# Patient Record
Sex: Male | Born: 1950 | Race: White | Hispanic: No | Smoking: Current every day smoker
Health system: Southern US, Community
[De-identification: ages and names within clinical notes are randomized; demographics above are authoritative.]

## PROBLEM LIST (undated history)

## (undated) DIAGNOSIS — H3530 Unspecified macular degeneration: Secondary | ICD-10-CM

## (undated) DIAGNOSIS — J449 Chronic obstructive pulmonary disease, unspecified: Secondary | ICD-10-CM

## (undated) DIAGNOSIS — F32A Depression, unspecified: Secondary | ICD-10-CM

## (undated) DIAGNOSIS — F329 Major depressive disorder, single episode, unspecified: Secondary | ICD-10-CM

## (undated) DIAGNOSIS — M810 Age-related osteoporosis without current pathological fracture: Secondary | ICD-10-CM

## (undated) DIAGNOSIS — I739 Peripheral vascular disease, unspecified: Secondary | ICD-10-CM

## (undated) DIAGNOSIS — E785 Hyperlipidemia, unspecified: Secondary | ICD-10-CM

## (undated) DIAGNOSIS — I219 Acute myocardial infarction, unspecified: Secondary | ICD-10-CM

## (undated) DIAGNOSIS — I2510 Atherosclerotic heart disease of native coronary artery without angina pectoris: Secondary | ICD-10-CM

## (undated) DIAGNOSIS — I10 Essential (primary) hypertension: Secondary | ICD-10-CM

## (undated) HISTORY — DX: Unspecified macular degeneration: H35.30

## (undated) HISTORY — DX: Acute myocardial infarction, unspecified: I21.9

## (undated) HISTORY — DX: Peripheral vascular disease, unspecified: I73.9

## (undated) HISTORY — DX: Depression, unspecified: F32.A

## (undated) HISTORY — DX: Age-related osteoporosis without current pathological fracture: M81.0

## (undated) HISTORY — DX: Major depressive disorder, single episode, unspecified: F32.9

## (undated) HISTORY — DX: Chronic obstructive pulmonary disease, unspecified: J44.9

## (undated) HISTORY — DX: Essential (primary) hypertension: I10

## (undated) HISTORY — DX: Atherosclerotic heart disease of native coronary artery without angina pectoris: I25.10

## (undated) HISTORY — DX: Hyperlipidemia, unspecified: E78.5

---

## 2007-04-19 ENCOUNTER — Other Ambulatory Visit: Payer: Self-pay

## 2007-04-19 ENCOUNTER — Emergency Department: Payer: Self-pay | Admitting: Emergency Medicine

## 2007-04-27 ENCOUNTER — Other Ambulatory Visit: Payer: Self-pay

## 2007-04-27 ENCOUNTER — Emergency Department: Payer: Self-pay | Admitting: Emergency Medicine

## 2007-06-14 HISTORY — PX: CORONARY ARTERY BYPASS GRAFT: SHX141

## 2009-02-09 ENCOUNTER — Emergency Department: Payer: Self-pay | Admitting: Emergency Medicine

## 2009-08-18 ENCOUNTER — Ambulatory Visit: Payer: Self-pay

## 2009-11-17 ENCOUNTER — Ambulatory Visit: Payer: Self-pay | Admitting: Cardiology

## 2009-11-17 ENCOUNTER — Inpatient Hospital Stay: Payer: Self-pay | Admitting: Internal Medicine

## 2011-04-20 IMAGING — CT CT ANGIOGRAPHY NECK
1 of 4 series · 12 of 33 positions shown · IV contrast (APPLIED)
Comparison: none

REASON FOR EXAM: carotid stenosis, see US report
COMMENTS:

PROCEDURE:     CT  - CT ANGIOGRAPHY NECK W/CONTRAST  - November 18, 2009  [DATE]
RESULT:     Indication: Carotid stenosis
Comparisons: None
TECHNIQUE: 100 ml of Isovue 370 was administered and the extracranial
carotid arteries bilaterally were scanned during the arterial phase from the
level of the superior portion of the frontal sinuses to the proximal neck.
These images were then transferred to the Siemens work station and were
subsequently reviewed utilizing 3-D reconstructions and MIP images.

[Series 4: soft tissue · axial · 0.39mm/px · z∈[+644,+911]mm · 12 of 107 slices shown]
[im 9/107  soft-tissue]
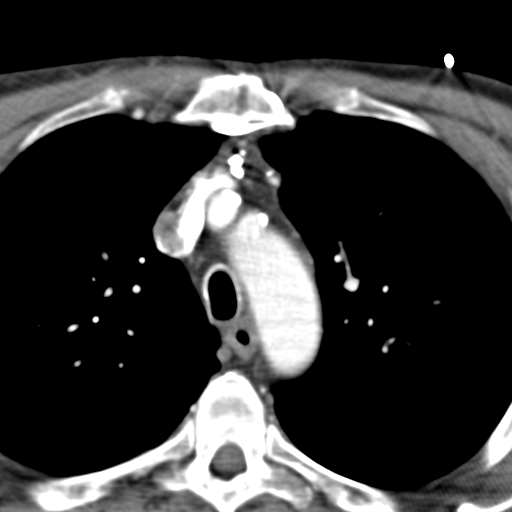
[im 17/107  bone]
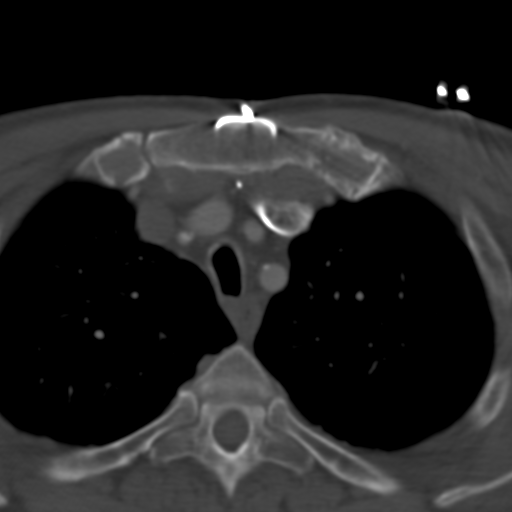
[im 25/107  soft-tissue]
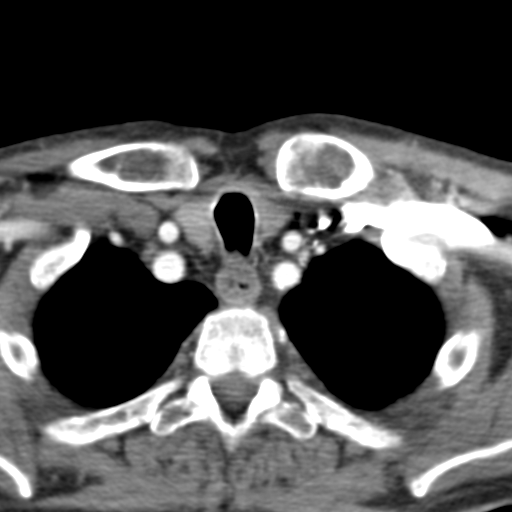
[im 33/107  bone]
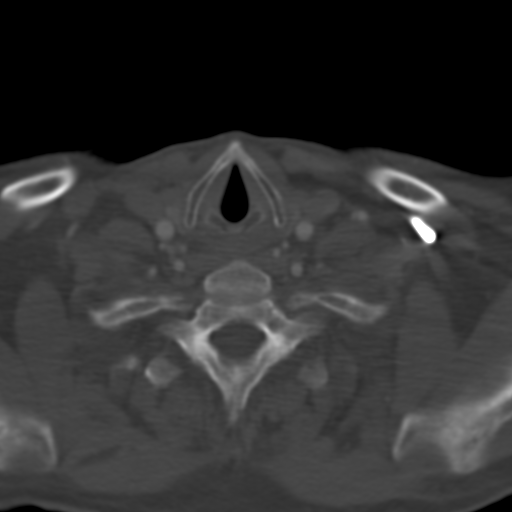
[im 41/107  soft-tissue]
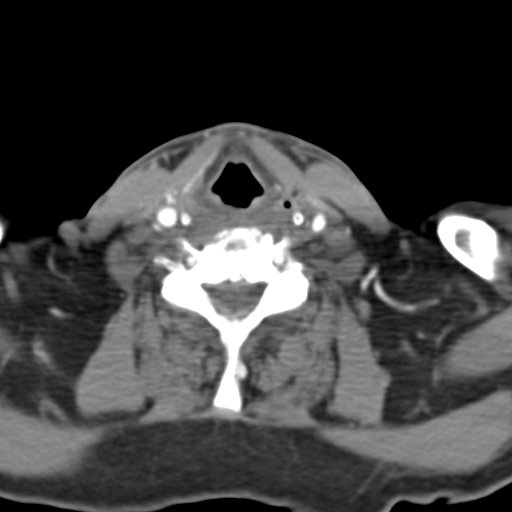
[im 49/107  bone]
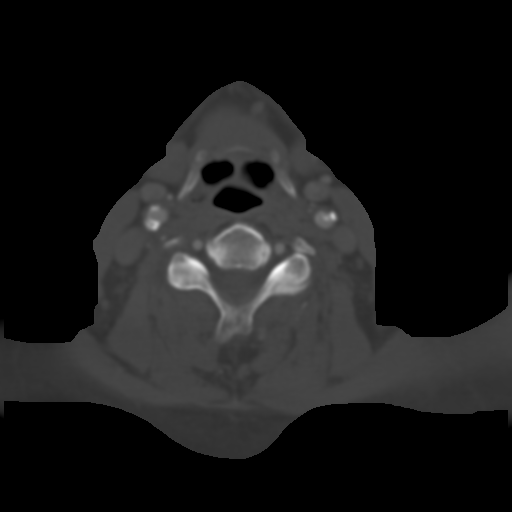
[im 58/107  soft-tissue]
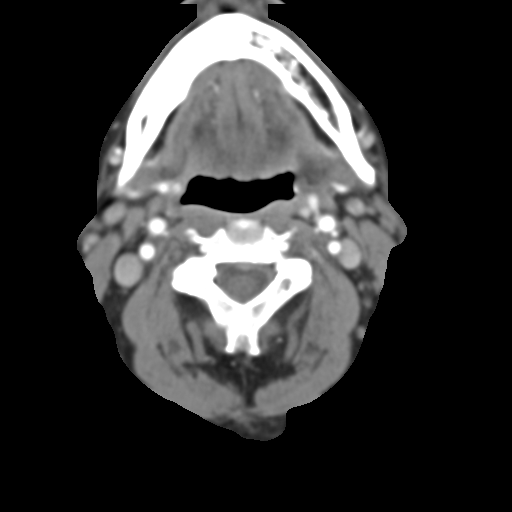
[im 66/107  bone]
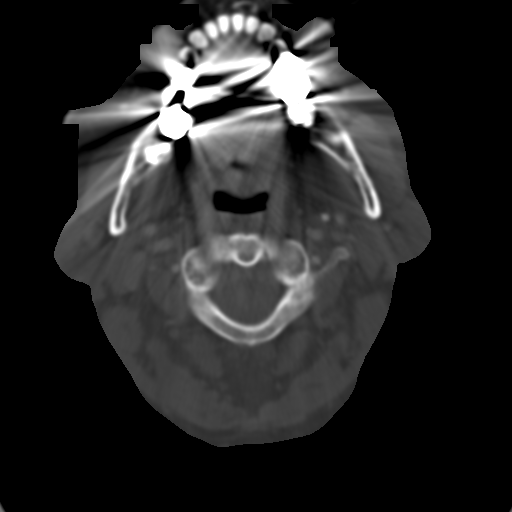
[im 74/107  soft-tissue]
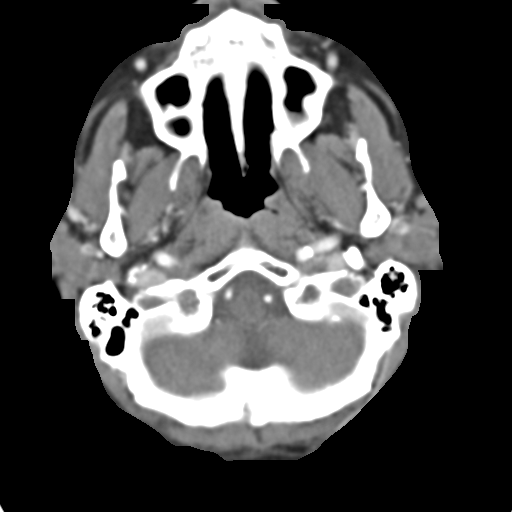
[im 82/107  bone]
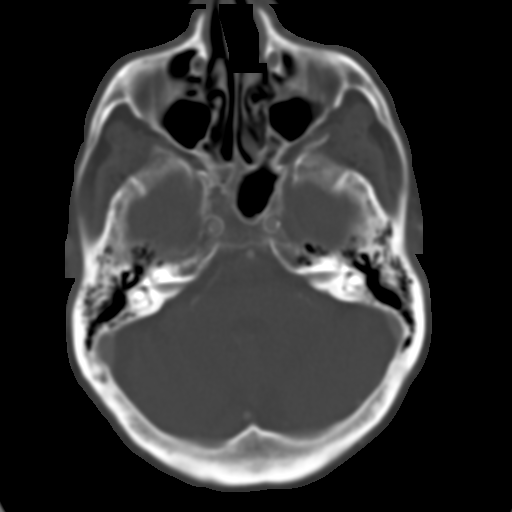
[im 90/107  soft-tissue]
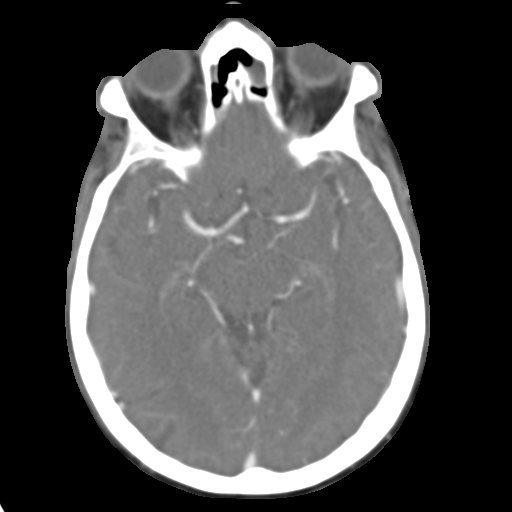
[im 98/107  bone]
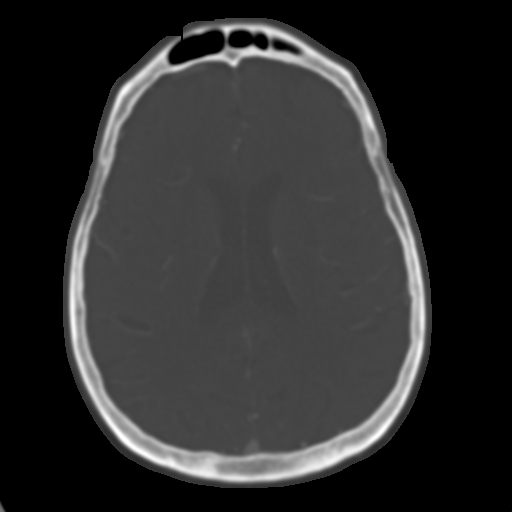

[12 of 33 positions shown; findings below may reference images not displayed]

FINDINGS: A three-vessel configuration of the aortic arch is identified with normal
ostium. The vertebral arteries are identified arising from the subclavian
arteries and entering the transverse foramen bilaterally at C6.

The carotid arteries are identified and are symmetric and unremarkable.
There is no evidence of aneurysm or carotid dissection bilaterally.

Right Carotid Artery: There is a moderate amount of atherosclerotic plaque
within the carotid bulb. There is approximately 50-60% stenosis of the
carotid bulb.

Left Carotid Artery: There is a moderate amount of atherosclerotic plaque
within the carotid bulb with less than 50% stenosis. There is mild
atherosclerotic plaque within the right mid common carotid artery resulting
in approximately 30-40% stenosis.

The visualized portions of the brain are unremarkable.

There is mild degenerative disc disease of the cervical spine most
significant at C5-C6 and C6-C7.
IMPRESSION: 1. Moderate amount of atherosclerotic plaque within the right carotid bulb
resulting in 50-60% stenosis.
2. Moderate amount of atherosclerotic plaque within the left carotid bulb
resulting in less than 50% stenosis.

## 2012-07-11 ENCOUNTER — Ambulatory Visit: Payer: Self-pay | Admitting: Family Medicine

## 2013-04-01 LAB — LIPID PANEL
Cholesterol: 158 mg/dL (ref 0–200)
HDL: 51 mg/dL (ref 35–70)
LDL CALC: 86 mg/dL
TRIGLYCERIDES: 103 mg/dL (ref 40–160)

## 2013-04-01 LAB — BASIC METABOLIC PANEL
BUN: 4 mg/dL (ref 4–21)
Creatinine: 0.6 mg/dL (ref 0.6–1.3)
Glucose: 71 mg/dL
Potassium: 5.2 mmol/L (ref 3.4–5.3)
Sodium: 132 mmol/L — AB (ref 137–147)

## 2013-04-01 LAB — HEPATIC FUNCTION PANEL: ALT: 13 U/L (ref 10–40)

## 2013-08-31 ENCOUNTER — Ambulatory Visit: Payer: Self-pay | Admitting: Vascular Surgery

## 2013-08-31 LAB — CREATININE, SERUM
Creatinine: 0.86 mg/dL (ref 0.60–1.30)
EGFR (Non-African Amer.): >60

## 2013-08-31 LAB — BUN: BUN: 5 mg/dL — AB (ref 7–18)

## 2013-12-11 IMAGING — CR RIGHT HIP - COMPLETE 2+ VIEW
1 series · 2 of 2 positions shown · non-contrast
Comparison: none

REASON FOR EXAM: pain
COMMENTS:

[Series 1: ap · 0.17mm/px · 2 of 2 slices shown]
[im 1/2]
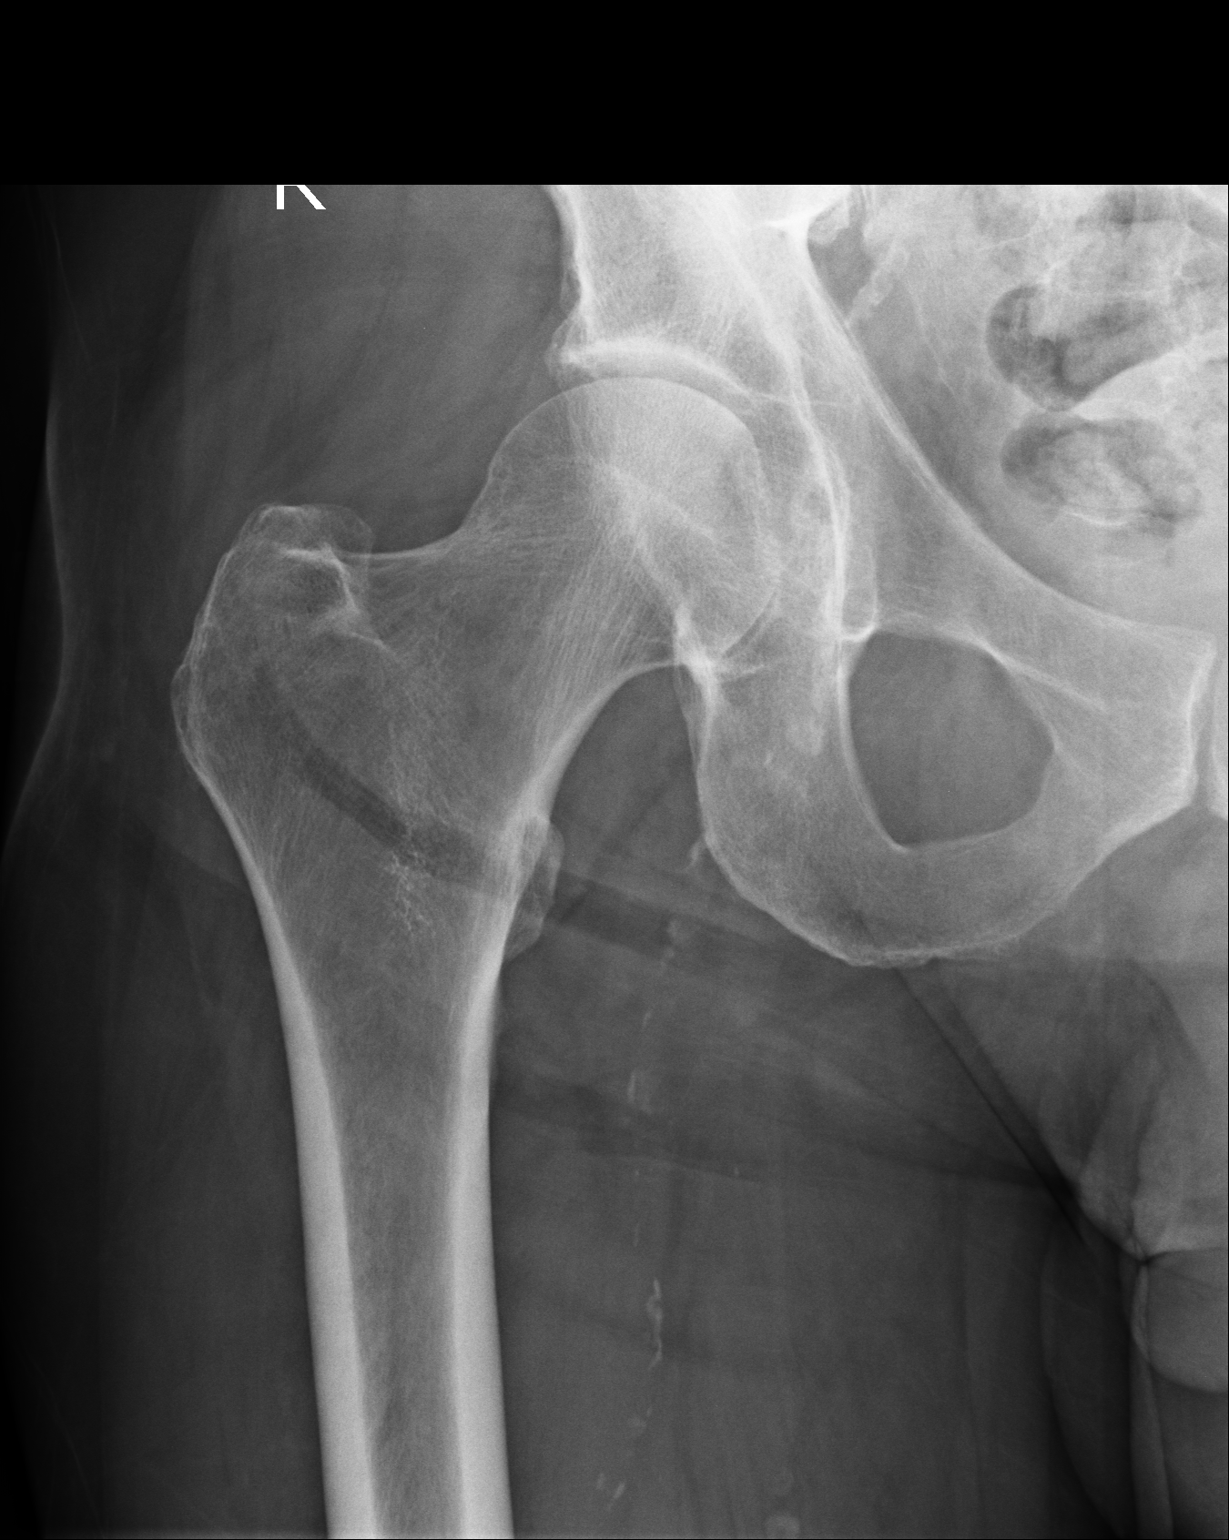
[im 2/2]
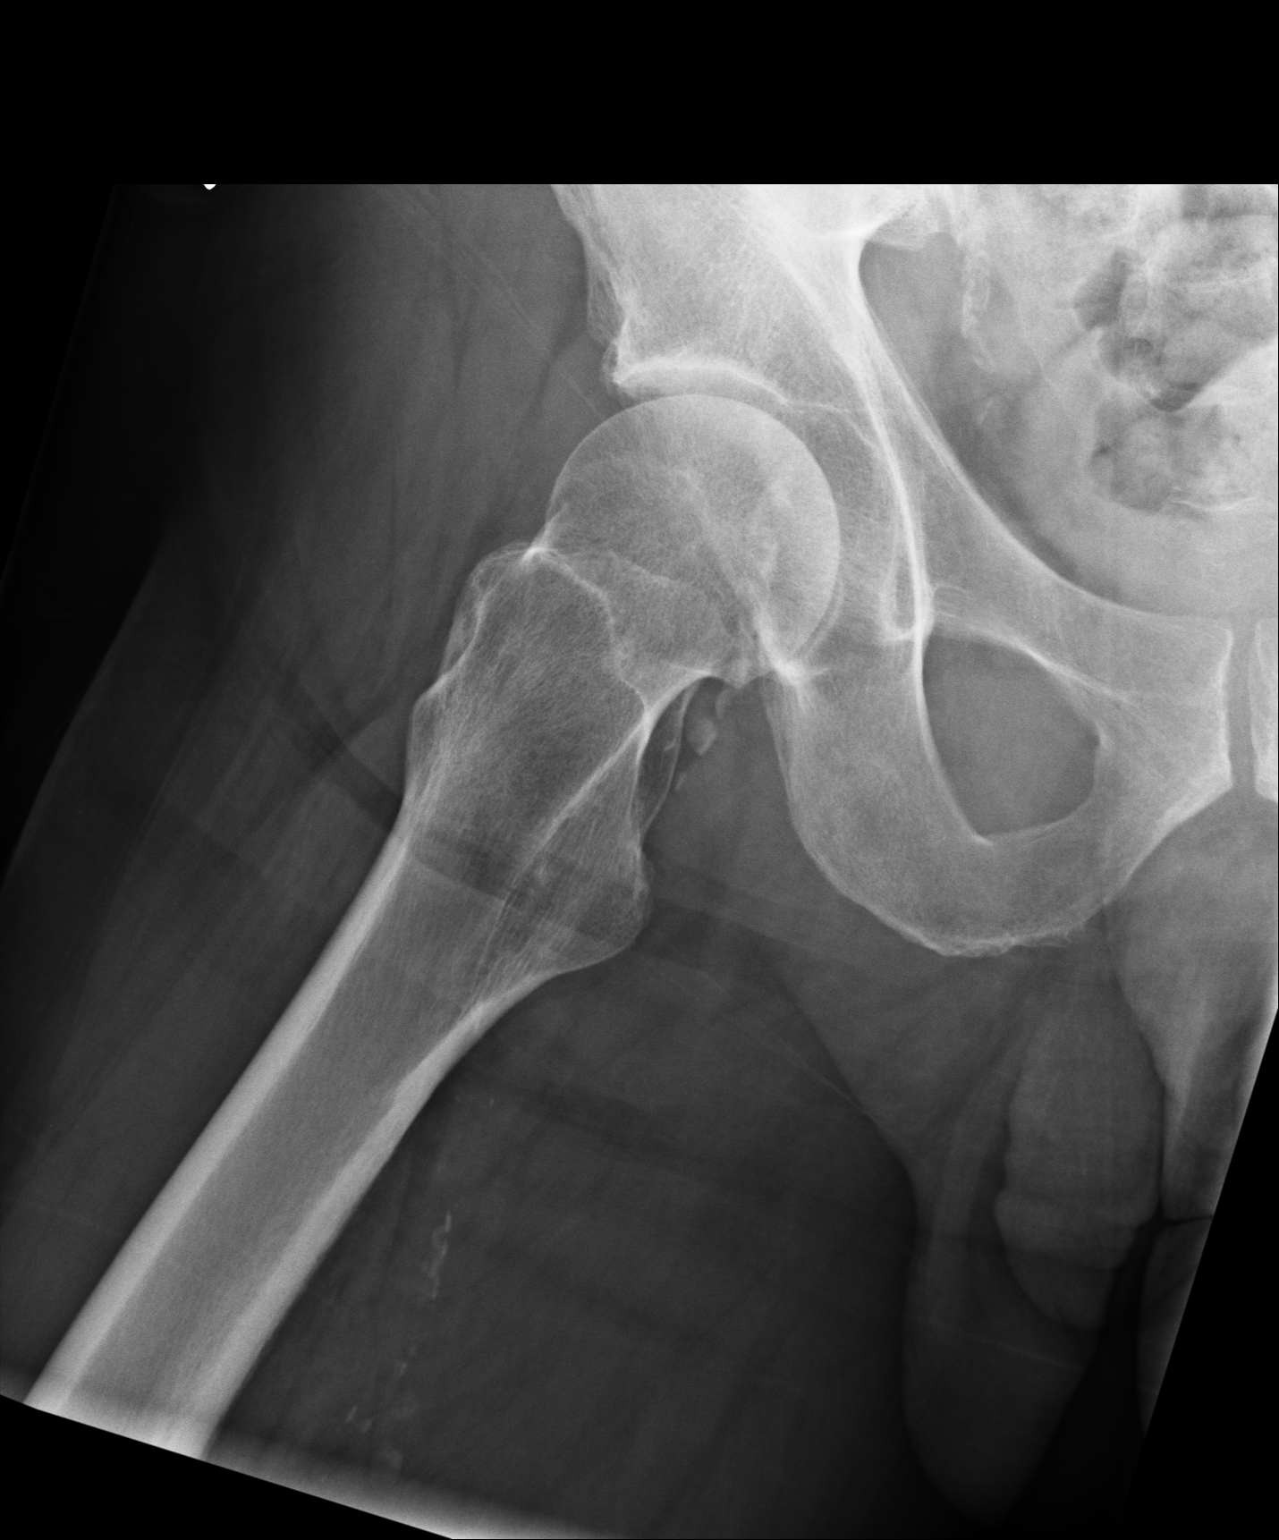

[2 of 2 positions shown; findings below may reference images not displayed]

PROCEDURE:     KDR - KDXR HIP RIGHT COMPLETE  - July 11, 2012 [DATE]

RESULT:     AP and lateral views of the right hip reveal the bones to be
adequately mineralized. The femoral head remains smoothly rounded. I do not
see significant degenerative change. There are calcifications in the wall of
the right femoral artery.
IMPRESSION: There is no acute or significant chronic abnormality of the
right hip.

[REDACTED]

## 2014-05-22 NOTE — Op Note (Signed)
PATIENT NAME:  Richard Kline, Richard Kline MR#:  [id number] DATE OF BIRTH:  1950/04/10  DATE OF PROCEDURE:  08/31/2013  PREOPERATIVE DIAGNOSES: 1.  Peripheral arterial disease with claudication, bilateral lower extremities.   2.  Hypertension.    POSTOPERATIVE DIAGNOSES: 1.  Peripheral arterial disease with claudication, bilateral lower extremities.   2.  Hypertension.    PROCEDURES PERFORMED:  1.  Ultrasound guidance for vascular access to bilateral femoral arteries.  2.  Catheter placement into aorta from bilateral femoral approaches.  3.  Aortogram and iliofemoral arteriogram.  4.  Stent placement to distal aorta with 14 mm diameter x 4 cm length self-expanding stent.  5.  Kissing balloon bilateral common iliac artery stent placements, with an 8 mm diameter x 39  cm length balloon expandable stent to bilateral common iliac arteries, and extension with Kline 9 mm diameter x 29 mm length balloon expandable stent on the right for longer segment disease.  6.  StarClose closure device, bilateral femoral arteries.   SURGEON: Annice NeedyJason S Dew, M.D.   ANESTHESIA: Local with moderate conscious sedation.   ESTIMATED BLOOD LOSS: 25 mL.   INDICATION FOR PROCEDURE: This is Kline 64 year old gentleman with disabling claudication symptoms to bilateral lower extremities. The right is slightly worse than the left. He is brought in for an angiogram for further evaluation and potential treatment. The risks and benefits were discussed. Informed consent was obtained.   DESCRIPTION OF PROCEDURE: The patient is brought to the vascular suite. Groins were shaved and prepped, and Kline sterile surgical field was created. Ultrasound was used to visualize both femoral arteries due to diminished femoral pulses, and both femoral arteries were accessed under ultrasound guidance without difficulty, in patent femoral arteries, in Kline staged fashion. Initially we performed access on the left and took images with the pigtail catheter. This demonstrated  distal aortic disease that was very calcific and moderate in its degree of stenosis, in the 60% to 65% range. There was then disease in the bilateral common iliac arteries that was quite severe. On the left, the degree of stenosis was about 80% to 85%, and tracked down about 1 cm to 1.5 cm into the left common iliac artery. On the right, the disease was Kline little more extensive. This was closer to 95% stenosis and tracked down 2 to 3 cm beyond the bifurcation.  The patient was heparinized. Access was gained on the right, and catheters had been placed into the aorta for bilateral femoral approaches, and intraluminal flow was confirmed. I then placed Magic Torque wires. I put Kline 14 mm diameter x 4 cm length self-expanding stent into the distal aorta to treat this disease. I initially post-dilated this with an 8 mm balloon to open the distal stent. The left side wire was then used to cannulate through the stent without difficulty with Kline Kumpe catheter. Then 8 mm diameter x 39 cm in length stents were placed in Kline kissing balloon fashion in both common iliac arteries, touching their tips in the distal aorta just within the previously placed stent.   Completion angiogram following this showed incomplete coverage of the right iliac lesion that extended about 5 to 8 mm further down from the stent, and I extended Kline 9 mm diameter x 29 mm length balloon expandable stent to cover this lesion, and took Kline 9 mm balloon up the left iliac and used this in Kline kissing balloon fashion while I was pulling the stent on the right. Kline 9 mm balloon was then  taken into the aorta to postdilate the aortic stent and the previous iliac stents as well. at this point, the pigtail catheter was replaced, and excellent flow was seen through patent aorta and bilateral common iliac artery stents, without any stenosis of greater than 10% to 15%. Both hypogastric arteries remained preserved. At this point, I elected to terminate the procedure. StarClose  closure device was deployed in the usual fashion, with excellent hemostatic result in both groins. The patient tolerated the procedure well, and was taken to the recovery room in stable condition.    ____________________________ Annice Needy, MD jsd:cg D: 08/31/2013 09:31:49 ET T: 08/31/2013 09:44:40 ET JOB#: [id number]  cc: Annice Needy, MD, <Dictator> Demetrios Isaacs. Sherrie Mustache, MD Annice Needy MD ELECTRONICALLY SIGNED 09/08/2013 14:14

## 2014-06-17 ENCOUNTER — Ambulatory Visit: Payer: Self-pay | Admitting: Family Medicine

## 2014-06-22 DIAGNOSIS — E871 Hypo-osmolality and hyponatremia: Secondary | ICD-10-CM | POA: Insufficient documentation

## 2014-06-22 DIAGNOSIS — M25551 Pain in right hip: Secondary | ICD-10-CM | POA: Insufficient documentation

## 2014-06-22 DIAGNOSIS — I739 Peripheral vascular disease, unspecified: Secondary | ICD-10-CM | POA: Insufficient documentation

## 2014-06-22 DIAGNOSIS — I10 Essential (primary) hypertension: Secondary | ICD-10-CM | POA: Insufficient documentation

## 2014-06-22 DIAGNOSIS — H3530 Unspecified macular degeneration: Secondary | ICD-10-CM | POA: Insufficient documentation

## 2014-06-22 DIAGNOSIS — E875 Hyperkalemia: Secondary | ICD-10-CM | POA: Insufficient documentation

## 2014-06-22 DIAGNOSIS — N529 Male erectile dysfunction, unspecified: Secondary | ICD-10-CM | POA: Insufficient documentation

## 2014-06-22 DIAGNOSIS — M47812 Spondylosis without myelopathy or radiculopathy, cervical region: Secondary | ICD-10-CM | POA: Insufficient documentation

## 2014-06-22 DIAGNOSIS — E785 Hyperlipidemia, unspecified: Secondary | ICD-10-CM | POA: Insufficient documentation

## 2014-06-22 DIAGNOSIS — M79606 Pain in leg, unspecified: Secondary | ICD-10-CM | POA: Insufficient documentation

## 2014-06-22 DIAGNOSIS — F329 Major depressive disorder, single episode, unspecified: Secondary | ICD-10-CM | POA: Insufficient documentation

## 2014-06-22 DIAGNOSIS — I2510 Atherosclerotic heart disease of native coronary artery without angina pectoris: Secondary | ICD-10-CM | POA: Insufficient documentation

## 2014-06-22 DIAGNOSIS — F32A Depression, unspecified: Secondary | ICD-10-CM | POA: Insufficient documentation

## 2014-06-22 DIAGNOSIS — I252 Old myocardial infarction: Secondary | ICD-10-CM | POA: Insufficient documentation

## 2014-06-22 DIAGNOSIS — J449 Chronic obstructive pulmonary disease, unspecified: Secondary | ICD-10-CM | POA: Insufficient documentation

## 2014-06-22 DIAGNOSIS — Z720 Tobacco use: Secondary | ICD-10-CM | POA: Insufficient documentation

## 2014-07-05 ENCOUNTER — Ambulatory Visit: Payer: Self-pay | Admitting: Family Medicine

## 2014-07-14 ENCOUNTER — Other Ambulatory Visit: Payer: Self-pay | Admitting: Family Medicine

## 2014-08-04 ENCOUNTER — Encounter: Payer: Self-pay | Admitting: Family Medicine

## 2014-08-18 ENCOUNTER — Encounter: Payer: Self-pay | Admitting: Family Medicine

## 2014-09-03 ENCOUNTER — Telehealth: Payer: Self-pay | Admitting: Family Medicine

## 2014-09-03 ENCOUNTER — Encounter: Payer: Self-pay | Admitting: Family Medicine

## 2014-09-03 ENCOUNTER — Ambulatory Visit (INDEPENDENT_AMBULATORY_CARE_PROVIDER_SITE_OTHER): Payer: Commercial Managed Care - HMO | Admitting: Family Medicine

## 2014-09-03 VITALS — BP 140/70 | HR 70 | Temp 98.9°F | Wt 161.0 lb

## 2014-09-03 DIAGNOSIS — M160 Bilateral primary osteoarthritis of hip: Secondary | ICD-10-CM | POA: Diagnosis not present

## 2014-09-03 DIAGNOSIS — Z720 Tobacco use: Secondary | ICD-10-CM

## 2014-09-03 DIAGNOSIS — F329 Major depressive disorder, single episode, unspecified: Secondary | ICD-10-CM

## 2014-09-03 DIAGNOSIS — I2510 Atherosclerotic heart disease of native coronary artery without angina pectoris: Secondary | ICD-10-CM | POA: Diagnosis not present

## 2014-09-03 DIAGNOSIS — J449 Chronic obstructive pulmonary disease, unspecified: Secondary | ICD-10-CM

## 2014-09-03 DIAGNOSIS — Z125 Encounter for screening for malignant neoplasm of prostate: Secondary | ICD-10-CM

## 2014-09-03 DIAGNOSIS — Z0000 Encounter for general adult medical examination without abnormal findings: Secondary | ICD-10-CM

## 2014-09-03 DIAGNOSIS — I10 Essential (primary) hypertension: Secondary | ICD-10-CM | POA: Diagnosis not present

## 2014-09-03 DIAGNOSIS — E785 Hyperlipidemia, unspecified: Secondary | ICD-10-CM

## 2014-09-03 DIAGNOSIS — F32A Depression, unspecified: Secondary | ICD-10-CM

## 2014-09-03 DIAGNOSIS — Z1211 Encounter for screening for malignant neoplasm of colon: Secondary | ICD-10-CM

## 2014-09-03 MED ORDER — HYDROCODONE-ACETAMINOPHEN 10-325 MG PO TABS: 1.0000 | ORAL_TABLET | Freq: Four times a day (QID) | ORAL | Status: AC | PRN

## 2014-09-03 MED ORDER — MOMETASONE FURO-FORMOTEROL FUM 200-5 MCG/ACT IN AERO: 2.0000 | INHALATION_SPRAY | Freq: Two times a day (BID) | RESPIRATORY_TRACT | Status: AC

## 2014-09-03 NOTE — Telephone Encounter (Signed)
Pt refused referral to eye doctor for routine eye exam

## 2014-09-03 NOTE — Patient Instructions (Signed)
Please stop smoking 

## 2014-09-03 NOTE — Progress Notes (Signed)
Patient: Richard Kline, Male    DOB: [birth year], 64 y.o.   MRN: [id number] Visit Date: 09/03/2014  Today's Provider: Mila Merry, MD   Chief Complaint  Patient presents with  . Physica  . Hyperlipidemia  . Hypertension  . Leg Pain   Subjective:   Physical Exam Is doing well with no particular complaints today.    Hypertension, follow-up:  BP Readings from Last 3 Encounters:  09/03/14 140/70  04/01/13 120/62    He was last seen for hypertension 17  months ago.  BP at that visit was 120/62. Management changes since that visit include none .He reports good compliance with treatment. He is not having side effects. none  He is not exercising. He is not adherent to low salt diet.   Outside blood pressures are N/A. He is experiencing none.  Patient denies none.   Cardiovascular risk factors include smoking/ tobacco exposure.    ------------------------------------------------------------------------    Lipid/Cholesterol, Follow-up:   Last seen for this 17  months ago.  Management changes since that visit include Increased Atorvastatin to 40 mg qd.  Last Lipid Panel:    Component Value Date/Time   CHOL 158 04/01/2013   TRIG 103 04/01/2013   HDL 51 04/01/2013   LDLCALC 86 04/01/2013    He reports good compliance with treatment. He is not having side effects. none  Wt Readings from Last 3 Encounters:  09/03/14 161 lb (73.029 kg)  04/01/13 169 lb (76.658 kg)    ------------------------------------------------------------------------   Hip Pain Complains of bilateral hip pain for a few year getting progressively worse. He states that the  hydrocodone is not very effective, but has taken  in the past which he feels was effective .  Review of Systems  Constitutional: Negative.  Negative for fever, chills, appetite change and fatigue.  HENT: Positive for dental problem and hearing loss. Negative for congestion, ear pain, nosebleeds and  trouble swallowing.   Eyes: Negative.  Negative for pain and visual disturbance.  Respiratory: Positive for shortness of breath. Negative for cough and chest tightness.   Cardiovascular: Negative.  Negative for chest pain, palpitations and leg swelling.  Gastrointestinal: Negative.  Negative for nausea, vomiting, abdominal pain, diarrhea, constipation and blood in stool.  Endocrine: Negative.  Negative for polydipsia, polyphagia and polyuria.  Genitourinary: Negative.  Negative for dysuria and flank pain.  Musculoskeletal: Positive for myalgias. Negative for back pain, joint swelling, arthralgias and neck stiffness.  Skin: Negative.  Negative for color change, rash and wound.  Allergic/Immunologic: Negative.   Neurological: Negative.  Negative for dizziness, tremors, seizures, speech difficulty, weakness, light-headedness and headaches.  Hematological: Bruises/bleeds easily.  Psychiatric/Behavioral: Negative.  Negative for behavioral problems, confusion, sleep disturbance, dysphoric mood and decreased concentration. The patient is not nervous/anxious.   All other systems reviewed and are negative.   History   Social History  . Marital Status: Divorced    Spouse Name: N/A  . Number of Children: N/A  . Years of Education: 12   Occupational History  . retired    Social History Main Topics  . Smoking status: Current Every Day Smoker -- 1.00 packs/day for 30 years    Types: Cigarettes  . Smokeless tobacco: Not on file  . Alcohol Use: 0.0 oz/week    0 Standard drinks or equivalent per week  . Drug Use: No  . Sexual Activity: Not on file   Other Topics Concern  . Not on file   Social History  Narrative    Patient Active Problem List   Diagnosis Date Noted  . Coronary artery disease 06/22/2014  . COPD (chronic obstructive pulmonary disease) 06/22/2014  . Depression 06/22/2014  . Erectile dysfunction 06/22/2014  . Right hip pain 06/22/2014  . H/O acute myocardial infarction  06/22/2014  . Hyperkalemia 06/22/2014  . Hyperlipidemia 06/22/2014  . Hypertension 06/22/2014  . Hyponatremia 06/22/2014  . Macular degeneration 06/22/2014  . 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 Peripheral artery disease 06/22/2014  . Tobacco abuse 06/22/2014  . Leg pain 06/22/2014    Past Surgical History  Procedure Laterality Date  . Coronary artery bypass graft  06/14/2007    [facility]; 2V    His family history includes Heart disease in his other.    Previous Medications   ALBUTEROL (PROVENTIL HFA;VENTOLIN HFA) 108 (90 BASE) MCG/ACT INHALER    Inhale 2 puffs into the lungs every 4 (four) hours as needed.   ASPIRIN 81 MG TABLET    Take 1 tablet by mouth daily.   ATORVASTATIN (LIPITOR) 40 MG TABLET    Take 1 tablet by mouth daily.   CLOPIDOGREL (PLAVIX) 75 MG TABLET    Take 1 tablet by mouth daily.   FUROSEMIDE (LASIX) 20 MG TABLET    Take 1 tablet by mouth daily.   IBUPROFEN (ADVIL,MOTRIN) 200 MG TABLET    daily as needed.   LOSARTAN (COZAAR) 100 MG TABLET    Take 1 tablet by mouth daily.   METOPROLOL (LOPRESSOR) 50 MG TABLET    Take 1 tablet by mouth 2 (two) times daily.   NAPROXEN SODIUM PO       SILDENAFIL (REVATIO) 20 MG TABLET    Take 2-3 tablets by mouth daily as needed. No more than 3 in one day   SPIRIVA HANDIHALER 18 MCG INHALATION CAPSULE    PLACE CAPSULE IN CHAMBER, CLOSE, EXHALE, PUT CHAMBER TO MOUTH AND INHALE ONCE A DAY.   VARENICLINE (CHANTIX) 1 MG TABLET    Take 1 tablet by mouth as directed.    Patient Care Team: Malva Limes, MD as PCP - General (Family Medicine) Annice Needy, MD as Referring Physician (Vascular Surgery)     Objective:   Vitals: BP 140/70 mmHg  Pulse 70  Temp(Src) 98.9 F (37.2 C) (Oral)  Wt 161 lb (73.029 kg)  SpO2 99%  Physical Exam  General Appearance:    Alert, cooperative, no distress, appears stated age  Head:    Normocephalic, without obvious abnormality, atraumatic  Eyes:    PERRL,  conjunctiva/corneas clear, EOM's intact, fundi    benign, both eyes       Ears:    Normal TM's and external ear canals, both ears  Nose:   Nares normal, septum midline, mucosa normal, no drainage   or sinus tenderness  Throat:   Lips, mucosa, and tongue normal; teeth and gums normal  Neck:   Supple, symmetrical, trachea midline, no adenopathy;       thyroid:  No enlargement/tenderness/nodules; no carotid   bruit or JVD  Back:     Symmetric, no curvature, ROM normal, no CVA tenderness  Lungs:     Clear to auscultation bilaterally, respirations unlabored  Chest wall:    No tenderness or deformity  Heart:    Regular rate and rhythm, S1 and S2 normal, no murmur, rub   or gallop  Abdomen:     Soft, non-tender, bowel sounds active all four quadrants,    no masses, no  organomegaly  Genitalia:    deferred  Rectal:    deferred  Extremities:   Extremities normal, atraumatic, no cyanosis or edema  Pulses:   2+ and symmetric all extremities  Skin:   Skin color, texture, turgor normal, no rashes or lesions  Lymph nodes:   Cervical, supraclavicular, and axillary nodes normal  Neurologic:   CNII-XII intact. Normal strength, sensation and reflexes      throughout    Activities of Daily Living In your present state of health, do you have any difficulty performing the following activities: 09/03/2014  Hearing? Y  Vision? N  Difficulty concentrating or making decisions? N  Walking or climbing stairs? Y  Dressing or bathing? N  Doing errands, shopping? N    Fall Risk Assessment Fall Risk  09/03/2014  Falls in the past year? No     Depression Screen PHQ 2/9 Scores 09/03/2014  PHQ - 2 Score 0  PHQ- 9 Score 0    Assessment & Plan:     Annual Wellness Visit  Reviewed patient's Family Medical History Reviewed and updated list of patient's medical providers Assessment of cognitive impairment was done Assessed patient's functional ability Established a written schedule for health screening  services Health Risk Assessent Completed and Reviewed  Exercise Activities and Dietary recommendations Goals    None      Immunization History  Administered Date(s) Administered  . Pneumococcal Polysaccharide-23 01/01/2013    Health Maintenance  Topic Date Due  . HIV Screening  02/12/1967  . TETANUS/TDAP  02/12/1971  . COLONOSCOPY  02/11/2002  . ZOSTAVAX  02/12/2012  . INFLUENZA VACCINE  08/30/2014      Discussed health benefits of physical activity, and encouraged him to engage in regular exercise appropriate for his age and condition.    ------------------------------------------------------------------------------------------------------------ 1. Annual physical exam Given prescription Zostavax - Ambulatory referral to Ophthalmology  2. Tobacco abuse Counseled to stop smoking - CT CHEST LUNG CA SCREEN LOW DOSE W/O CM; Future   3. Essential hypertension well controlled Continue current medications.   - Renal function panel - EKG 12-Lead   4. Hyperlipidemia He is tolerating atorvastatin well with no adverse effects.   - Lipid panel - T4 AND TSH  5. Depression Resolved  6. Chronic obstructive pulmonary disease, unspecified COPD, unspecified chronic bronchitis type Counseled to stop smoking. Add Dulera, continue spiriva.  - mometasone-formoterol (DULERA) 200-5 MCG/ACT AERO; Inhale 2 puffs into the lungs 2 (two) times daily.  Dispense: 2 Inhaler; Refill: 0  7. Coronary artery disease involving native coronary artery of native heart without angina pectoris Asymptomatic. Compliant with medication.  Continue aggressive risk factor modification.   - EKG 12-Lead   8. Prostate cancer screening  - PSA  9. Primary osteoarthritis of both hips He states hydrocodone 5-325 worked well in the past only if he took 2 at a time. He feels he only needs to take this occasionally.  - HYDROcodone-acetaminophen (NORCO) 10-325 MG per tablet; Take 1 tablet by mouth every 6  (six) hours as needed.  Dispense: 30 tablet; Refill: 0  10. Colon cancer screening  - Cologuard

## 2014-09-22 ENCOUNTER — Other Ambulatory Visit: Payer: Self-pay | Admitting: Family Medicine

## 2014-09-29 ENCOUNTER — Telehealth: Payer: Self-pay | Admitting: Family Medicine

## 2014-09-29 NOTE — Telephone Encounter (Signed)
FYI   Pt wanted to let you know he has not done the lab work that you ordered from 8/5 appt.  He had the Req. But didn't understand that he needed to have it done at that time.  He said he was going next week to get it done.  When his results come back you can call him at  [phone].  Thanks [employer]

## 2014-10-05 ENCOUNTER — Telehealth: Payer: Self-pay | Admitting: *Deleted

## 2014-10-05 LAB — COLOGUARD: Cologuard: NEGATIVE

## 2014-10-05 NOTE — Telephone Encounter (Signed)
Patient called to cancel Zenaida Niece and CT screening planned for 10/08/14. Will attempt to reschedule at later date.

## 2014-11-01 ENCOUNTER — Telehealth: Payer: Self-pay | Admitting: *Deleted

## 2014-11-01 NOTE — Telephone Encounter (Signed)
Attempted to reschedule for Ct screening scan of lungs and patient reports that he "has too much going on" and is not interested in scan at this time. Encouraged patient to let Dr. Sherrie Mustache know when he was able to have scan done and we would be happy to help in any way possible.

## 2014-11-10 ENCOUNTER — Telehealth: Payer: Self-pay | Admitting: Family Medicine

## 2014-11-10 NOTE — Telephone Encounter (Signed)
Spoke with pt. Pt stated that he was not able to get the shingles vaccine at the [facility] before leaving to [place], and wants to know if a new prescription can be [facility] [place] at the address below. He also stated that his mother currently has the shingles, that it has been for 1 - 2 weeks now. He also want to be called at [phone].  Please advise,  Thanks,

## 2014-11-10 NOTE — Telephone Encounter (Signed)
We can't send a prescription to [place], but he we can write prescription for him and he can take it there if he likes.

## 2014-11-10 NOTE — Telephone Encounter (Signed)
Patient stated that he was able to get the shingles vaccine today at a pharmacy in [place].

## 2014-11-10 NOTE — Telephone Encounter (Signed)
Pt states the shingles vaccine was sent to [facility] but he had to leave to go to [place] and was not able to have the vaccine before he left.  Pt is requesting the shingle vaccine sent to [facility] [address] [place] [id number].  Phone #[phone].  Pt states he is in [place] with his mother and she has shingles right now.  CB#[phone]/MW

## 2014-11-18 ENCOUNTER — Other Ambulatory Visit: Payer: Self-pay | Admitting: *Deleted

## 2014-11-19 ENCOUNTER — Other Ambulatory Visit: Payer: Self-pay | Admitting: Family Medicine

## 2014-11-19 MED ORDER — FUROSEMIDE 20 MG PO TABS: 20.0000 mg | ORAL_TABLET | Freq: Every day | ORAL | Status: DC

## 2014-11-23 ENCOUNTER — Other Ambulatory Visit: Payer: Self-pay

## 2014-11-23 MED ORDER — FUROSEMIDE 20 MG PO TABS: 20.0000 mg | ORAL_TABLET | Freq: Every day | ORAL | Status: AC

## 2014-11-23 NOTE — Telephone Encounter (Signed)
Last ov was on 09/03/14.   Thanks,

## 2014-12-06 ENCOUNTER — Other Ambulatory Visit: Payer: Self-pay | Admitting: Family Medicine

## 2014-12-06 MED ORDER — ALBUTEROL SULFATE HFA 108 (90 BASE) MCG/ACT IN AERS: 2.0000 | INHALATION_SPRAY | RESPIRATORY_TRACT | Status: AC | PRN

## 2014-12-06 NOTE — Telephone Encounter (Signed)
Pt called needing refill on his albuterol (PROVENTIL HFA;VENTOLIN HFA) 108 (90 BASE) MCG/ACT inhaler  [place] [facility] [place]  [phone]  Thanks Barth Kirkseri

## 2016-04-27 ENCOUNTER — Telehealth: Payer: Self-pay | Admitting: Family Medicine

## 2016-04-27 NOTE — Telephone Encounter (Signed)
Pt called to say he had been contacted about scheduling an appt for MWE and PE.  He does not live here in [place] any longer.  He is now in [facility].  Barth Kirks

## 2020-09-29 DEATH — deceased
